# Patient Record
Sex: Male | Born: 1975 | Race: White | Hispanic: No | Marital: Married | Smoking: Former smoker
Health system: Southern US, Community
[De-identification: ages and names within clinical notes are randomized; demographics above are authoritative.]

---

## 2004-07-19 ENCOUNTER — Observation Stay (HOSPITAL_COMMUNITY): Admission: EM | Admit: 2004-07-19 | Discharge: 2004-07-20 | Payer: Self-pay | Admitting: Emergency Medicine

## 2004-07-19 ENCOUNTER — Ambulatory Visit: Payer: Self-pay | Admitting: Internal Medicine

## 2010-08-01 ENCOUNTER — Ambulatory Visit (HOSPITAL_COMMUNITY)
Admission: RE | Admit: 2010-08-01 | Discharge: 2010-08-01 | Disposition: A | Discharge status: Home / Self Care | Payer: BLUE CROSS/BLUE SHIELD | Source: Ambulatory Visit | Attending: Orthopedic Surgery | Admitting: Orthopedic Surgery

## 2010-08-01 ENCOUNTER — Other Ambulatory Visit (HOSPITAL_COMMUNITY): Payer: Self-pay | Admitting: Orthopedic Surgery

## 2010-08-01 ENCOUNTER — Encounter (HOSPITAL_COMMUNITY)
Admission: RE | Admit: 2010-08-01 | Discharge: 2010-08-01 | Disposition: A | Discharge status: Home / Self Care | Payer: BLUE CROSS/BLUE SHIELD | Source: Ambulatory Visit | Attending: Orthopedic Surgery | Admitting: Orthopedic Surgery

## 2010-08-01 DIAGNOSIS — G959 Disease of spinal cord, unspecified: Secondary | ICD-10-CM

## 2010-08-01 DIAGNOSIS — R0602 Shortness of breath: Secondary | ICD-10-CM | POA: Insufficient documentation

## 2010-08-01 DIAGNOSIS — M4712 Other spondylosis with myelopathy, cervical region: Secondary | ICD-10-CM | POA: Insufficient documentation

## 2010-08-01 DIAGNOSIS — Z01818 Encounter for other preprocedural examination: Secondary | ICD-10-CM | POA: Insufficient documentation

## 2010-08-01 DIAGNOSIS — J45909 Unspecified asthma, uncomplicated: Secondary | ICD-10-CM | POA: Insufficient documentation

## 2010-08-01 DIAGNOSIS — Z01812 Encounter for preprocedural laboratory examination: Secondary | ICD-10-CM | POA: Insufficient documentation

## 2010-08-01 DIAGNOSIS — Z01810 Encounter for preprocedural cardiovascular examination: Secondary | ICD-10-CM | POA: Insufficient documentation

## 2010-08-01 LAB — URINALYSIS, ROUTINE W REFLEX MICROSCOPIC
Bilirubin Urine: NEGATIVE
Glucose, UA: NEGATIVE mg/dL
Urobilinogen, UA: 0.2 mg/dL (ref 0.0–1.0)

## 2010-08-01 LAB — CBC
MCHC: 36.8 g/dL — ABNORMAL HIGH (ref 30.0–36.0)
RBC: 4.76 MIL/uL (ref 4.22–5.81)
WBC: 7.6 10*3/uL (ref 4.0–10.5)

## 2010-08-01 LAB — DIFFERENTIAL
Eosinophils Relative: 1 % (ref 0–5)
Lymphocytes Relative: 31 % (ref 12–46)
Lymphs Abs: 2.3 10*3/uL (ref 0.7–4.0)
Monocytes Absolute: 0.6 10*3/uL (ref 0.1–1.0)
Monocytes Relative: 8 % (ref 3–12)

## 2010-08-01 LAB — COMPREHENSIVE METABOLIC PANEL
AST: 38 U/L — ABNORMAL HIGH (ref 0–37)
Albumin: 4.4 g/dL (ref 3.5–5.2)
Glucose, Bld: 97 mg/dL (ref 70–99)
Potassium: 4.2 mEq/L (ref 3.5–5.1)
Total Bilirubin: 0.9 mg/dL (ref 0.3–1.2)
Total Protein: 6.8 g/dL (ref 6.0–8.3)

## 2010-08-01 LAB — PROTIME-INR: Prothrombin Time: 12.6 seconds (ref 11.6–15.2)

## 2010-08-01 LAB — SURGICAL PCR SCREEN: Staphylococcus aureus: NEGATIVE

## 2010-08-01 LAB — APTT: aPTT: 42 seconds — ABNORMAL HIGH (ref 24–37)

## 2010-08-03 ENCOUNTER — Ambulatory Visit (HOSPITAL_COMMUNITY)
Admission: RE | Admit: 2010-08-03 | Discharge: 2010-08-03 | Disposition: A | Discharge status: Home / Self Care | Payer: BLUE CROSS/BLUE SHIELD | Source: Ambulatory Visit | Attending: Orthopedic Surgery | Admitting: Orthopedic Surgery

## 2010-08-03 ENCOUNTER — Other Ambulatory Visit (HOSPITAL_COMMUNITY): Payer: Self-pay | Admitting: Orthopedic Surgery

## 2010-08-03 ENCOUNTER — Observation Stay (HOSPITAL_COMMUNITY)
Admission: RE | Admit: 2010-08-03 | Discharge: 2010-08-04 | DRG: 865 | Disposition: A | Discharge status: Home / Self Care | Payer: BLUE CROSS/BLUE SHIELD | Source: Ambulatory Visit | Attending: Orthopedic Surgery | Admitting: Orthopedic Surgery

## 2010-08-03 DIAGNOSIS — M4322 Fusion of spine, cervical region: Secondary | ICD-10-CM

## 2010-08-03 DIAGNOSIS — M5000 Cervical disc disorder with myelopathy, unspecified cervical region: Principal | ICD-10-CM | POA: Insufficient documentation

## 2010-08-03 DIAGNOSIS — Z01818 Encounter for other preprocedural examination: Secondary | ICD-10-CM | POA: Insufficient documentation

## 2010-08-03 DIAGNOSIS — J45909 Unspecified asthma, uncomplicated: Secondary | ICD-10-CM | POA: Insufficient documentation

## 2010-08-03 DIAGNOSIS — G4733 Obstructive sleep apnea (adult) (pediatric): Secondary | ICD-10-CM | POA: Insufficient documentation

## 2010-08-10 NOTE — Op Note (Signed)
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  PROCEDURE: 1. Anterior cervical decompression and fusion C4-5, C5-6. 2. Placement of structural allograft x2. 3. Use of local autograft. 4. Placement of anterior instrumentation. 5. Intraoperative use of fluoroscopy.  SURGEON:  Estill Bamberg, MD  ASSISTANT:  Janace Litten, PA  ANESTHESIA:  General endotracheal anesthesia.  ESTIMATED BLOOD LOSS:  Minimal.  COMPLICATIONS:  None.  FINDINGS:  Large C4-5 and C5-6 disk herniations causing spinal cord compression and right greater than left neural foraminal stenosis.  INDICATIONS FOR PROCEDURE:  Briefly, Mr. Thibeau is a very pleasant 35- year-old male who presented to my office on July 15, 2010, with a history of neck pain.  He also complained of intermittent deterioration of fine motor skills mainly involving the right hand.  I did review an MRI at that time which was consistent with a C4-5 and C5-6 disk herniation which was clearly causing deflection of the spinal cord Hansika Leaming to the right side, then on the left.  We did discuss observation versus surgical intervention and the patient and I both agreed to think this through and give this some time.  I then again saw the patient on July 26, 2010, and we again had an additional discussion.  Given the significant compression of spinal cord and intermittent clumsiness involving the right hand and history of intermittently  dropping objects involving the right hand, we did have a discussion regarding going forward with a C4-5 anterior cervical decompression and fusion in addition to C5-6 anterior cervical decompression and fusion.  The patient fully understood the risks and limitations of the procedure as outlined in my preoperative note.  OPERATIVE DETAILS:  On August 03, 2010, the patient was brought to surgery and general endotracheal anesthesia was administered. Antibiotics were given and SCDs were placed.  A time-out procedure was performed.  Prior to this, the patient was placed supine on the hospital bed.  The bed was placed in gentle degree reverse Trendelenburg and neck was placed in gentle degree of extension.  I then brought in a live fluoroscopy in order to localize the location of the C4-5 and C5-6 intervertebral disks.  The neck was then prepped and draped in the usual sterile fashion.  I then made a transverse incision in line with the patient's skin crease from the midline to the medial border of the sternocleidomastoid muscle.  The platysma was then sharply incised using electrocautery and the plane between the sternocleidomastoid muscle laterally and the strap muscles medially was identified and developed. The anterior cervical spine was readily identified.  I then placed a spinal needle what turned  out to be the C5-6 interspace.  This was confirmed using a lateral intraoperative fluoroscopic view.  I then subperiosteally exposed the vertebral bodies of C4, C5 and C6 out to the uncovertebral joints on both the right and the left sides.  Osteophytes were removed, mainly involving the C5-6 interspace and these were placed on the back table.  I did use a 4-mm bur to decorticate and remove anterior osteophytes as well.  This autograft was placed in the back table and used in subsequent aspect of the procedure.  I then placed a self-retaining shadow line retractor and a 1-pound weight was  hung laterally and a 2-pound weight was hung medially.  I then used a 15- blade knife to perform annulotomy anteriorly and a preliminary diskectomy was performed and approximately the anterior two-thirds of the disk was removed onto the uncovertebral joints bilaterally.  I then placed Caspar pin distractors in the C5-C6 vertebral bodies and gentle distraction was applied.  I then used a series of curettes in addition to Kerrison punches and a nerve hook in order to remove the entire intervertebral disk out to the posterior longitudinal ligament.  The posterior longitudinal ligament was then entered using a nerve hook and the posterior longitudinal ligament was taken down out to the neural foramen on both the right and left sides.  At the termination of the decompression, I was easily able to pass a nerve hook out the right and left uncovertebral joints.  At this point, I gently prepared the endplates above and below using a 4-mm high-speed bur in addition to a rasp.  I then placed a series of trials and I ultimately felt that an 8- mm trial would be the most appropriate fit.  An 8-mm parallel allograft obtained from [employer] [facility] [facility] was selected and hydrated.  This was tamped into position under distraction and then the distraction was removed.  Bone wax was placed into the holes where the Caspar pins were removed from.  I was very happy with the final press fit.  Of note, I packed autograft obtained from the burring aspect of the procedure initially into the intervertebral space above and below the allograft.  I then turned my attention towards the C4-5 interspace and a diskectomy was performed in the manner just described.  Again, at the termination of the procedure, I was easily able to pass a nerve hook out the right and left neural foramina.  I then went forward with sizing and I did feel the 7-mm intervertebral graft would be the most appropriate fit.  I then  selected a 7-mm allograft from the [employer] [facility] and this was tamped into position in the usual fashion.  I again noted an excellent press fit.  I then chose an appropriately sized Synthes Vector plate.  This was placed over the anterior cervical spine and I did use AP and lateral fluoroscopic views to help optimize the location of the plate.  I then used a 2-mm bur to decorticate the vertebral bodies in anticipation of the 16-mm self-drilling and self-tapping screws.  I then placed two 16 mm screws into the vertebral bodies of C4, two in C5 and two in C6.  I did note excellent purchase of the screws and I did note that the screws were captured in the locking mechanism of the plate.  Again, this was done using fluoroscopy in order to help optimize the location of the plate and the screws.  I was very happy with the final  construct.  At this point, I copiously irrigated the wound using approximately 500 mL of normal saline.  The platysma was then closed using 2-0 Vicryl.  The skin was then closed using 3-0 Monocryl.  A hard collar was then placed.  The patient was then awoken from general endotracheal anesthesia and transferred to recovery in stable condition.  Of note, all instrument counts were correct at the termination of the procedure.  Of note, Janace Litten was my assistant throughout the procedure and aided in essential retraction and suctioning throughout the procedure.     Estill Bamberg, MD     MD/MEDQ  D:  08/03/2010  T:  08/04/2010  Job:  [id number]  Electronically Signed by Estill Bamberg  on 08/10/2010 08:14:43 AM

## 2010-08-10 NOTE — Discharge Summary (Addendum)
  Dustin Campos, Dustin Campos               ACCOUNT NO.:  [id number]  MEDICAL RECORD NO.:  [id number]           PATIENT TYPE:  O  LOCATION:  XRAY                         FACILITY:  [facility]  PHYSICIAN:  Estill Bamberg, MD      DATE OF BIRTH:  18-Apr-1975  DATE OF ADMISSION:  08/03/2010 DATE OF DISCHARGE:  08/04/2010                              DISCHARGE SUMMARY   PREOPERATIVE DIAGNOSES: 1. Cervical myelopathy. 2. Large C4-5 and large C5-6 disk herniation causing spinal cord     compression.  POSTOPERATIVE DIAGNOSES: 1. Cervical myelopathy. 2. Large C4-5 and large C5-6 disk herniation causing spinal cord     compression.  PROCEDURE AND DATE:  C4-5 and C5-6 anterior cervical diskectomy and fusion on August 03, 2010.  ADMITTING PHYSICIAN:  Estill Bamberg, MD  ADMISSION HISTORY:  Briefly, [name] is a pleasant 35 year old male who presented to my office with neck pain and symptoms suggestive of myelopathy.  I did review an MRI which was consistent with large disk herniations causing spinal cord compression at the C4-5 and C5-6 levels and I therefore had a discussion regarding going forward with a 2-level ACDF.  The patient was admitted on August 03, 2010, for the procedure noted above.  HOSPITAL COURSE:  On April 18. 2012, the patient was brought to Surgery and underwent procedure noted above.  The patient tolerated the procedure well and was transferred to Recovery in stable condition.  The patient was evaluated on postop day #1.  His neurovascular status was consistent with baseline.  The patient's pain was well controlled and a decision was made to discharge the patient home uneventfully.  Of note, the patient did have some postoperative nausea and he was given a prescription for Phenergan to help combat his nausea.  DISCHARGE INSTRUCTIONS:  The patient will be maintained in his hard cervical collar at all times.  He is given a [place] collar to be used for showering.  He will  use Valium for muscle spasms and Percocet for pain and he was also called in a prescription for Phenergan for nausea.  The patient will follow up with me in approximately 2 weeks.     Estill Bamberg, MD     MD/MEDQ  D:  08/05/2010  T:  08/05/2010  Job:  [id number]  Electronically Signed by [name]  on 08/21/2010 12:32:08 PM

## 2012-05-06 IMAGING — RF DG CERVICAL SPINE 1V
1 series · 2 of 2 positions shown · non-contrast
Comparison: None

CLINICAL DATA: C4-C6 ACDF

CERVICAL SPINE - 1 VIEW

[Series 1: run · 2 of 2 slices shown]
[im 1/2]
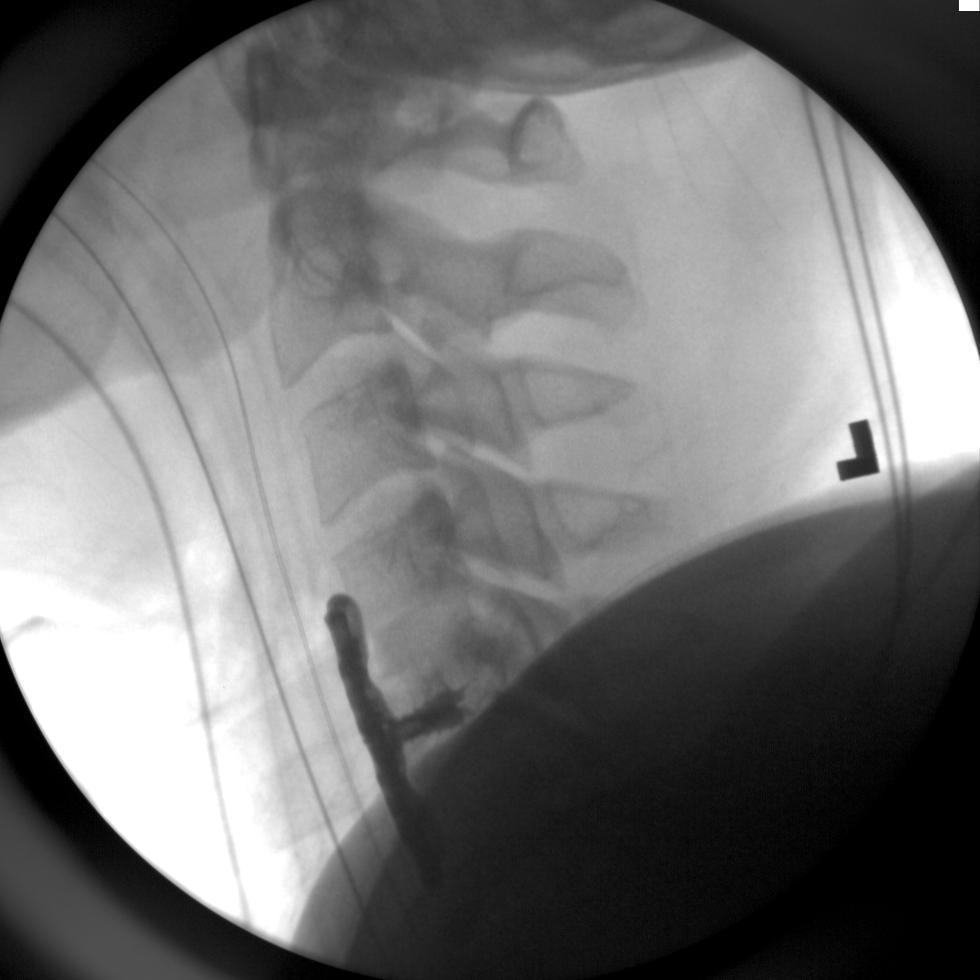
[im 2/2]
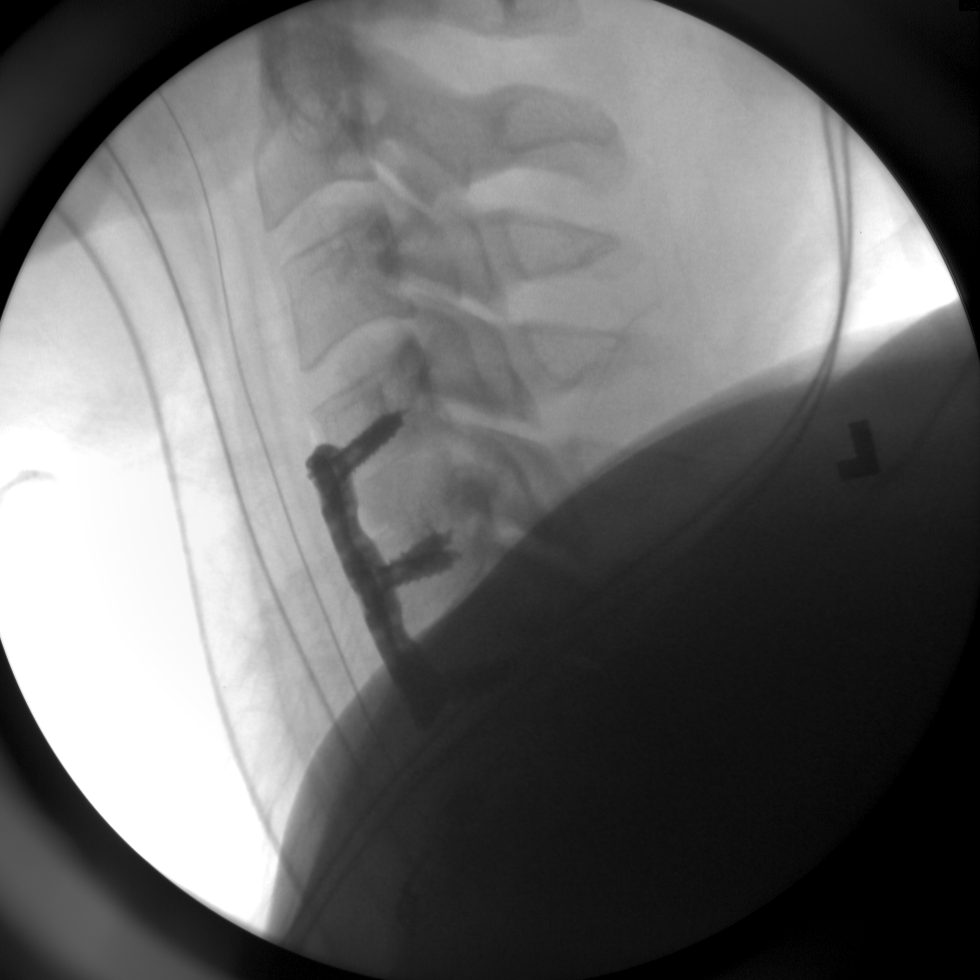

[2 of 2 positions shown; findings below may reference images not displayed]

FINDINGS: Two images show anterior cervical discectomy and fusion
from C4-C6.  Interbody fusion material is in place.  There is an
anterior plate with screw fixation.  No radiographically detectable
complication.
IMPRESSION: ACDF C4-C6

## 2013-06-09 ENCOUNTER — Emergency Department (HOSPITAL_COMMUNITY)
Admission: EM | Admit: 2013-06-09 | Discharge: 2013-06-09 | Discharge status: Against Medical Advice | Payer: BC Managed Care – PPO

## 2016-03-14 ENCOUNTER — Ambulatory Visit: Payer: BLUE CROSS/BLUE SHIELD | Admitting: Neurology

## 2016-03-14 ENCOUNTER — Telehealth: Payer: Self-pay | Admitting: *Deleted

## 2016-03-14 NOTE — Telephone Encounter (Signed)
No showed new patient appointment. 

## 2020-08-28 ENCOUNTER — Encounter (HOSPITAL_COMMUNITY): Payer: Self-pay

## 2020-08-28 ENCOUNTER — Ambulatory Visit (HOSPITAL_COMMUNITY)
Admission: EM | Admit: 2020-08-28 | Discharge: 2020-08-28 | Disposition: A | Discharge status: Home / Self Care | Payer: BLUE CROSS/BLUE SHIELD | Attending: Emergency Medicine | Admitting: Emergency Medicine

## 2020-08-28 ENCOUNTER — Other Ambulatory Visit: Payer: Self-pay

## 2020-08-28 DIAGNOSIS — F419 Anxiety disorder, unspecified: Secondary | ICD-10-CM

## 2020-08-28 DIAGNOSIS — R739 Hyperglycemia, unspecified: Secondary | ICD-10-CM

## 2020-08-28 DIAGNOSIS — J069 Acute upper respiratory infection, unspecified: Secondary | ICD-10-CM

## 2020-08-28 DIAGNOSIS — R42 Dizziness and giddiness: Secondary | ICD-10-CM | POA: Diagnosis not present

## 2020-08-28 LAB — CBG MONITORING, ED: Glucose-Capillary: 210 mg/dL — ABNORMAL HIGH (ref 70–99)

## 2020-08-28 NOTE — ED Triage Notes (Signed)
Pt presents with multiple complaints: pt states he has had intermittent feeling of jitterness, dizziness, vision changes, sinus pressure, and epigastric abdominal cramping.  Pt has been battling an URI for past few weeks and did not finish steriod pack prescribed.

## 2020-08-28 NOTE — Discharge Instructions (Addendum)
Stop taking the decongestants.  You can continue taking the antibiotics as prescribed.    Make sure you are drinking plenty of fluids.    If your symptoms do not improve or worsen, go to the Emergency Department for further evaluation.    Follow up with your primary care provider in the next few days for re-evaluation.   For diabetes or elevated blood sugar, please make sure you are limiting and avoiding starchy, carbohydrate foods like pasta, breads, sweet breads, pastry, rice, potatoes, desserts. These foods can elevate your blood sugar. Also, limit and avoid drinks that contain a lot of sugar such as sodas, sweet teas, fruit juices.  Drinking plain water will be much more helpful, try 64 ounces of water daily.  It is okay to flavor your water naturally by cutting cucumber, lemon, mint or lime, placing it in a picture with water and drinking it over a period of 24-48 hours as long as it remains refrigerated.  For elevated blood pressure, make sure you are monitoring salt in your diet.  Do not eat restaurant foods and limit processed foods at home. I highly recommend you prepare and cook your own foods at home.  Processed foods include things like frozen meals, pre-seasoned meats and dinners, deli meats, canned foods as these foods contain a high amount of sodium/salt.  Make sure you are paying attention to sodium labels on foods you buy at the grocery store. Buy your spices separately such as garlic powder, onion powder, cumin, cayenne, parsley flakes so that you can avoid seasonings that contain salt. However, salt-free seasonings are available and can be used, an example is Mrs. Dash and includes a lot of different mixtures that do not contain salt.  Lastly, when cooking using oils that are healthier for you is important. This includes olive oil, avocado oil, canola oil. We have discussed a lot of foods to avoid but below is a list of foods that can be very healthy to use in your diet whether it is  for diabetes, cholesterol, high blood pressure, or in general healthy eating.  Salads - kale, spinach, cabbage, spring mix, arugula Fruits - avocadoes, berries (blueberries, raspberries, blackberries), apples, oranges, pomegranate, grapefruit, kiwi Vegetables - asparagus, cauliflower, broccoli, green beans, brussel sprouts, bell peppers, beets; stay away from or limit starchy vegetables like potatoes, carrots, peas Other general foods - kidney beans, egg whites, almonds, walnuts, sunflower seeds, pumpkin seeds, fat free yogurt, almond milk, flax seeds, quinoa, oats  Meat - It is better to eat lean meats and limit your red meat including pork to once a week.  Wild caught fish, chicken breast are good options as they tend to be leaner sources of good protein. Still be mindful of the sodium labels for the meats you buy.  DO NOT EAT ANY FOODS ON THIS LIST THAT YOU ARE ALLERGIC TO. For more specific needs, I highly recommend consulting a dietician or nutritionist but this can definitely be a good starting point.

## 2020-08-28 NOTE — ED Provider Notes (Signed)
[facility]    CSN: [id number] Arrival date & time: 08/28/20  1218      History   Chief Complaint Chief Complaint  Patient presents with  . Gastroesophageal Reflux  . Dizziness  . Abdominal Pain    HPI Dustin Campos is a 45 y.o. male.   Patient here for evaluation of multiple complaints.  Reports recently evaluated by primary care and given antibiotics for upper respiratory infection.  Reports developing some dizziness,dry mouth, acid reflux, and abdominal pain around 2 AM this morning.  Reports that symptoms resolved on their own but then returned around 8 AM and have not resolved.  Reports taking Mucinex for URI symptoms.  Patient reports concern about diabetes as he was told that he was prediabetic.  Reports that wife checked blood sugar this morning but he is unsure of the number because he did not want it to "stress him out".  Patient also does report history of Mnire's disease as well as anxiety/panic attacks. Denies any trauma, injury, or other precipitating event.  Denies any specific alleviating or aggravating factors.  Denies any fevers, shortness of breath, N/V/D, numbness, tingling, weakness, abdominal pain, or headaches.     The history is provided by the patient.    History reviewed. No pertinent past medical history.  There are no problems to display for this patient.   History reviewed. No pertinent surgical history.     Home Medications    Prior to Admission medications   Not on File    Family History Family History  Family history unknown: Yes    Social History Social History   Tobacco Use  . Smoking status: Former Smoker     Allergies   Patient has no known allergies.   Review of Systems Review of Systems  Cardiovascular: Positive for chest pain.  Gastrointestinal: Positive for abdominal pain.  Neurological: Positive for dizziness. Negative for facial asymmetry, light-headedness and headaches.  All other systems  reviewed and are negative.    Physical Exam Triage Vital Signs ED Triage Vitals [08/28/20 1245]  Enc Vitals Group     BP (!) 162/95     Pulse Rate 84     Resp 20     Temp 98.6 F (37 C)     Temp Source Oral     SpO2 100 %     Weight      Height      Head Circumference      Peak Flow      Pain Score 6     Pain Loc      Pain Edu?      Excl. in GC?    No data found.  Updated Vital Signs BP (!) 162/95 (BP Location: Right Arm)   Pulse 84   Temp 98.6 F (37 C) (Oral)   Resp 20   SpO2 100%   Visual Acuity Right Eye Distance:   Left Eye Distance:   Bilateral Distance:    Right Eye Near:   Left Eye Near:    Bilateral Near:     Physical Exam Vitals and nursing note reviewed.  Constitutional:      General: He is not in acute distress.    Appearance: Normal appearance. He is not ill-appearing, toxic-appearing or diaphoretic.  HENT:     Head: Normocephalic and atraumatic.     Mouth/Throat:     Mouth: Mucous membranes are moist.  Eyes:     Extraocular Movements: Extraocular movements intact.  Conjunctiva/sclera: Conjunctivae normal.     Pupils: Pupils are equal, round, and reactive to light.     Visual Fields: Right eye visual fields normal and left eye visual fields normal.  Cardiovascular:     Rate and Rhythm: Normal rate and regular rhythm.     Pulses: Normal pulses.     Heart sounds: Normal heart sounds.  Pulmonary:     Effort: Pulmonary effort is normal.     Breath sounds: Normal breath sounds.  Abdominal:     General: Abdomen is flat. Bowel sounds are normal.     Palpations: Abdomen is soft.     Tenderness: There is no abdominal tenderness.     Hernia: No hernia is present.  Musculoskeletal:        General: Normal range of motion.     Cervical back: Normal range of motion.  Skin:    General: Skin is warm and dry.  Neurological:     General: No focal deficit present.     Mental Status: He is alert and oriented to person, place, and time.     GCS:  GCS eye subscore is 4. GCS verbal subscore is 5. GCS motor subscore is 6.     Cranial Nerves: Cranial nerves are intact.     Motor: Motor function is intact.     Coordination: Coordination is intact.     Gait: Gait is intact.  Psychiatric:        Mood and Affect: Mood normal.      UC Treatments / Results  Labs (all labs ordered are listed, but only abnormal results are displayed) Labs Reviewed  CBG MONITORING, ED - Abnormal; Notable for the following components:      Result Value   Glucose-Capillary 210 (*)    All other components within normal limits    EKG   Radiology No results found.  Procedures Procedures (including critical care time)  Medications Ordered in UC Medications - No data to display  Initial Impression / Assessment and Plan / UC Course  I have reviewed the triage vital signs and the nursing notes.  Pertinent labs & imaging results that were available during my care of the patient were reviewed by me and considered in my medical decision making (see chart for details).     Assessment unremarkable with no red flags or concerns, including CVA or intracranial abnormality. CBG 210 in office, but patient does report eating breakfast this am.   Patient given diet instructions to try and prevent elevated blood sugars and encouraged to follow up with PCP for elevated blood sugar.  Dizziness unlikely to be related to blood sugar but could be related to history of Mnire's disease or anxiety.  Patient also reports taking antibiotics and multiple medications and decongestants for URI, which could attribute to symptoms especially dry mouth.  Encouraged patient to stop decongestants but can continue taking antibiotics.  Encouraged fluids and rest.  Instructed patient to follow up with primary care provider as soon as possible for re-evaluation.  Patient told to the Emergency Department for any worsening symptoms or if he develops any new concerning symptoms.   Final  Clinical Impressions(s) / UC Diagnoses   Final diagnoses:  Elevated blood sugar  Acute upper respiratory infection  Dizziness  Anxiety     Discharge Instructions     Stop taking the decongestants.  You can continue taking the antibiotics as prescribed.    Make sure you are drinking plenty of fluids.  If your symptoms do not improve or worsen, go to the Emergency Department for further evaluation.    Follow up with your primary care provider in the next few days for re-evaluation.   For diabetes or elevated blood sugar, please make sure you are limiting and avoiding starchy, carbohydrate foods like pasta, breads, sweet breads, pastry, rice, potatoes, desserts. These foods can elevate your blood sugar. Also, limit and avoid drinks that contain a lot of sugar such as sodas, sweet teas, fruit juices.  Drinking plain water will be much more helpful, try 64 ounces of water daily.  It is okay to flavor your water naturally by cutting cucumber, lemon, mint or lime, placing it in a picture with water and drinking it over a period of 24-48 hours as long as it remains refrigerated.  For elevated blood pressure, make sure you are monitoring salt in your diet.  Do not eat restaurant foods and limit processed foods at home. I highly recommend you prepare and cook your own foods at home.  Processed foods include things like frozen meals, pre-seasoned meats and dinners, deli meats, canned foods as these foods contain a high amount of sodium/salt.  Make sure you are paying attention to sodium labels on foods you buy at the grocery store. Buy your spices separately such as garlic powder, onion powder, cumin, cayenne, parsley flakes so that you can avoid seasonings that contain salt. However, salt-free seasonings are available and can be used, an example is Mrs. Dash and includes a lot of different mixtures that do not contain salt.  Lastly, when cooking using oils that are healthier for you is important.  This includes olive oil, avocado oil, canola oil. We have discussed a lot of foods to avoid but below is a list of foods that can be very healthy to use in your diet whether it is for diabetes, cholesterol, high blood pressure, or in general healthy eating.  Salads - kale, spinach, cabbage, spring mix, arugula Fruits - avocadoes, berries (blueberries, raspberries, blackberries), apples, oranges, pomegranate, grapefruit, kiwi Vegetables - asparagus, cauliflower, broccoli, green beans, brussel sprouts, bell peppers, beets; stay away from or limit starchy vegetables like potatoes, carrots, peas Other general foods - kidney beans, egg whites, almonds, walnuts, sunflower seeds, pumpkin seeds, fat free yogurt, almond milk, flax seeds, quinoa, oats  Meat - It is better to eat lean meats and limit your red meat including pork to once a week.  Wild caught fish, chicken breast are good options as they tend to be leaner sources of good protein. Still be mindful of the sodium labels for the meats you buy.  DO NOT EAT ANY FOODS ON THIS LIST THAT YOU ARE ALLERGIC TO. For more specific needs, I highly recommend consulting a dietician or nutritionist but this can definitely be a good starting point.     ED Prescriptions    None     PDMP not reviewed this encounter.   Ivette Loyal, NP 08/28/20 1436
